# Patient Record
Sex: Female | Born: 1935 | Hispanic: Yes | Smoking: Never smoker
Health system: Southern US, Community
[De-identification: ages and names within clinical notes are randomized; demographics above are authoritative.]

## PROBLEM LIST (undated history)

## (undated) DIAGNOSIS — R0989 Other specified symptoms and signs involving the circulatory and respiratory systems: Secondary | ICD-10-CM

## (undated) DIAGNOSIS — G4700 Insomnia, unspecified: Secondary | ICD-10-CM

## (undated) DIAGNOSIS — B958 Unspecified staphylococcus as the cause of diseases classified elsewhere: Secondary | ICD-10-CM

## (undated) DIAGNOSIS — I6529 Occlusion and stenosis of unspecified carotid artery: Secondary | ICD-10-CM

## (undated) DIAGNOSIS — E041 Nontoxic single thyroid nodule: Secondary | ICD-10-CM

## (undated) DIAGNOSIS — E785 Hyperlipidemia, unspecified: Secondary | ICD-10-CM

## (undated) DIAGNOSIS — R7989 Other specified abnormal findings of blood chemistry: Secondary | ICD-10-CM

## (undated) DIAGNOSIS — Z872 Personal history of diseases of the skin and subcutaneous tissue: Secondary | ICD-10-CM

## (undated) HISTORY — DX: Personal history of diseases of the skin and subcutaneous tissue: Z87.2

## (undated) HISTORY — PX: TONSILLECTOMY: SUR1361

## (undated) HISTORY — DX: Insomnia, unspecified: G47.00

## (undated) HISTORY — DX: Hyperlipidemia, unspecified: E78.5

## (undated) HISTORY — DX: Nontoxic single thyroid nodule: E04.1

## (undated) HISTORY — DX: Unspecified staphylococcus as the cause of diseases classified elsewhere: B95.8

## (undated) HISTORY — DX: Other specified abnormal findings of blood chemistry: R79.89

## (undated) HISTORY — DX: Occlusion and stenosis of unspecified carotid artery: I65.29

## (undated) HISTORY — DX: Other specified symptoms and signs involving the circulatory and respiratory systems: R09.89

---

## 2012-12-03 DIAGNOSIS — R7989 Other specified abnormal findings of blood chemistry: Secondary | ICD-10-CM

## 2012-12-03 DIAGNOSIS — I6529 Occlusion and stenosis of unspecified carotid artery: Secondary | ICD-10-CM

## 2012-12-03 HISTORY — PX: OTHER SURGICAL HISTORY: SHX169

## 2012-12-03 HISTORY — DX: Other specified abnormal findings of blood chemistry: R79.89

## 2012-12-03 HISTORY — DX: Occlusion and stenosis of unspecified carotid artery: I65.29

## 2013-06-12 LAB — BASIC METABOLIC PANEL
BUN: 19 mg/dL (ref 4–21)
Creatinine: 0.8 mg/dL (ref 0.5–1.1)
Glucose: 98 mg/dL
POTASSIUM: 4.6 mmol/L (ref 3.4–5.3)
Sodium: 139 mmol/L (ref 137–147)

## 2013-06-12 LAB — CBC AND DIFFERENTIAL
HCT: 38 % (ref 36–46)
HEMOGLOBIN: 12.7 g/dL (ref 12.0–16.0)
PLATELETS: 163 10*3/uL (ref 150–399)
WBC: 4.6 10^3/mL

## 2013-06-12 LAB — LIPID PANEL
Cholesterol: 197 mg/dL (ref 0–200)
HDL: 73 mg/dL — AB (ref 35–70)
LDL CALC: 92 mg/dL
Triglycerides: 155 mg/dL (ref 40–160)

## 2013-06-12 LAB — HEPATIC FUNCTION PANEL
ALK PHOS: 65 U/L (ref 25–125)
ALT: 31 U/L (ref 7–35)
AST: 28 U/L (ref 13–35)
BILIRUBIN, TOTAL: 0.6 mg/dL

## 2013-06-12 LAB — TSH: TSH: 4.76 u[IU]/mL (ref 0.41–5.90)

## 2013-06-18 DIAGNOSIS — R0989 Other specified symptoms and signs involving the circulatory and respiratory systems: Secondary | ICD-10-CM

## 2013-06-18 HISTORY — DX: Other specified symptoms and signs involving the circulatory and respiratory systems: R09.89

## 2013-06-24 DIAGNOSIS — E041 Nontoxic single thyroid nodule: Secondary | ICD-10-CM

## 2013-06-24 HISTORY — DX: Nontoxic single thyroid nodule: E04.1

## 2013-08-13 HISTORY — PX: BIOPSY THYROID: PRO38

## 2013-11-02 DIAGNOSIS — B958 Unspecified staphylococcus as the cause of diseases classified elsewhere: Secondary | ICD-10-CM

## 2013-11-02 DIAGNOSIS — Z872 Personal history of diseases of the skin and subcutaneous tissue: Secondary | ICD-10-CM

## 2013-11-02 HISTORY — DX: Personal history of diseases of the skin and subcutaneous tissue: Z87.2

## 2013-11-02 HISTORY — DX: Unspecified staphylococcus as the cause of diseases classified elsewhere: B95.8

## 2013-11-23 LAB — BASIC METABOLIC PANEL
BUN: 16 mg/dL (ref 4–21)
Creatinine: 0.7 mg/dL (ref 0.5–1.1)
GLUCOSE: 91 mg/dL
Potassium: 4.1 mmol/L (ref 3.4–5.3)
SODIUM: 142 mmol/L (ref 137–147)

## 2013-11-23 LAB — HEPATIC FUNCTION PANEL: Alkaline Phosphatase: 63 U/L (ref 25–125)

## 2014-03-10 DIAGNOSIS — Z0000 Encounter for general adult medical examination without abnormal findings: Secondary | ICD-10-CM | POA: Diagnosis not present

## 2014-03-10 DIAGNOSIS — Z23 Encounter for immunization: Secondary | ICD-10-CM | POA: Diagnosis not present

## 2014-03-10 DIAGNOSIS — G4700 Insomnia, unspecified: Secondary | ICD-10-CM | POA: Diagnosis not present

## 2014-03-10 DIAGNOSIS — R946 Abnormal results of thyroid function studies: Secondary | ICD-10-CM | POA: Diagnosis not present

## 2014-03-10 LAB — TSH: TSH: 2.73 u[IU]/mL (ref 0.41–5.90)

## 2014-08-26 DIAGNOSIS — Z23 Encounter for immunization: Secondary | ICD-10-CM | POA: Diagnosis not present

## 2015-06-17 ENCOUNTER — Telehealth: Payer: Self-pay | Admitting: *Deleted

## 2015-06-17 NOTE — Telephone Encounter (Signed)
Unable to reach patient at time of Pre-Visit Call.  Left message for patient to return call when available.    

## 2015-06-20 ENCOUNTER — Ambulatory Visit (INDEPENDENT_AMBULATORY_CARE_PROVIDER_SITE_OTHER): Payer: Medicare Other | Admitting: Internal Medicine

## 2015-06-20 ENCOUNTER — Encounter: Payer: Self-pay | Admitting: Internal Medicine

## 2015-06-20 VITALS — BP 106/74 | HR 89 | Temp 98.1°F | Ht 60.0 in | Wt 133.1 lb

## 2015-06-20 DIAGNOSIS — E041 Nontoxic single thyroid nodule: Secondary | ICD-10-CM | POA: Diagnosis not present

## 2015-06-20 DIAGNOSIS — J4520 Mild intermittent asthma, uncomplicated: Secondary | ICD-10-CM | POA: Diagnosis not present

## 2015-06-20 DIAGNOSIS — R0989 Other specified symptoms and signs involving the circulatory and respiratory systems: Secondary | ICD-10-CM

## 2015-06-20 DIAGNOSIS — J45909 Unspecified asthma, uncomplicated: Secondary | ICD-10-CM | POA: Insufficient documentation

## 2015-06-20 DIAGNOSIS — E785 Hyperlipidemia, unspecified: Secondary | ICD-10-CM | POA: Insufficient documentation

## 2015-06-20 MED ORDER — MONTELUKAST SODIUM 10 MG PO TABS: 10.0000 mg | ORAL_TABLET | Freq: Every day | ORAL | Status: AC

## 2015-06-20 MED ORDER — ALBUTEROL SULFATE HFA 108 (90 BASE) MCG/ACT IN AERS: 2.0000 | INHALATION_SPRAY | Freq: Four times a day (QID) | RESPIRATORY_TRACT | Status: AC | PRN

## 2015-06-20 NOTE — Assessment & Plan Note (Signed)
Reports a thyroid biopsy before with normal TSH. Will get records.

## 2015-06-20 NOTE — Progress Notes (Signed)
Pre visit review using our clinic review tool, if applicable. No additional management support is needed unless otherwise documented below in the visit note. 

## 2015-06-20 NOTE — Assessment & Plan Note (Signed)
Reports a history of asthma, asymptomatic except for the last few days with cough, on exam there is no wheezing. Patient would like to have Singulair and albuterol in case she needs it. Prescriptions provided.

## 2015-06-20 NOTE — Patient Instructions (Addendum)
Please sign and fax a release of information for your previous doctor: get all records    If  cough, take Mucinex DM twice a day as needed  If nasal  congestion use OTC Nasocort or Flonase : 2 nasal sprays on each side of the nose daily until you feel better   If you develop wheezing  or asthma type of symptoms: Start singular  And use albuterol as needed

## 2015-06-20 NOTE — Progress Notes (Signed)
   Subjective:    Patient ID: Paula Rollins, female    DOB: 1932/02/01, 81 y.o.   MRN: [id number]  DOS:  06/20/2015 Type of visit - description : New patient, to get established. here with a family member Interval history: Patient used to see Dr. Cristino Martes in [place] likes to transfer to his practice. She is concerned about her history of asthma which has been inactive for a while however in the last few days she has developed cough and would like to have some medication in case she gets worse   Review of Systems At this point denies any fever chills No sinus pain or congestion. No nasal discharge No nausea, vomiting, diarrhea.  Past Medical History  Diagnosis Date  . Hyperlipidemia   . Solitary thyroid nodule 06/24/2013    Hypervascular.  Referred to Dr Steffanie Dunn 06/2013. bx neg per pt    . Carotid artery bruit 06/18/2013    Overview:  Mild atherosclerosis. No stenosis as per Doppler 06/2013     Past Surgical History  Procedure Laterality Date  . Tonsillectomy      History   Social History  . Marital Status: Widowed    Spouse Name: N/A  . Number of Children: 4  . Years of Education: N/A   Occupational History  . Not on file.   Social History Main Topics  . Smoking status: Never Smoker   . Smokeless tobacco: Not on file  . Alcohol Use: No  . Drug Use: No  . Sexual Activity: Not on file   Other Topics Concern  . Not on file   Social History Narrative   Original from [place]   In [place] since ~ 2012     Family History  Problem Relation Age of Onset  . Breast cancer Neg Hx   . Colon cancer Neg Hx   . CAD Neg Hx        Medication List       This list is accurate as of: 06/20/15 11:59 PM.  Always use your most recent med list.               albuterol 108 (90 BASE) MCG/ACT inhaler  Commonly known as:  VENTOLIN HFA  Inhale 2 puffs into the lungs every 6 (six) hours as needed for wheezing or shortness of breath.     montelukast 10 MG  tablet  Commonly known as:  SINGULAIR  Take 1 tablet (10 mg total) by mouth at bedtime.           Objective:   Physical Exam BP 106/74 mmHg  Pulse 89  Temp(Src) 98.1 F (36.7 C) (Oral)  Ht 5' (1.524 m)  Wt 133 lb 2 oz (60.385 kg)  BMI 26.00 kg/m2  SpO2 97%    General:   Well developed, well nourished . NAD.  HEENT:  Normocephalic . Face symmetric, atraumatic;  no thyromegaly TMs normal, sinus no TTP, nose is slightly congested Neck: Good carotid pulses B Lungs:  CTA B Normal respiratory effort, no intercostal retractions, no accessory muscle use. Heart: RRR,  no murmur.  No pretibial edema bilaterally  Skin: Not pale. Not jaundice Neurologic:  alert & oriented X3.  Speech normal, gait appropriate for age and unassisted Psych--  Cognition and judgment appear intact.  Cooperative with normal attention span and concentration.  Behavior appropriate. No anxious or depressed appearing.   Assessment & Plan:

## 2015-06-20 NOTE — Assessment & Plan Note (Signed)
Normal carotid pulses today. Get records

## 2015-07-14 ENCOUNTER — Telehealth: Payer: Self-pay | Admitting: Internal Medicine

## 2015-07-14 NOTE — Telephone Encounter (Signed)
Labs 06/11/2013: TSH was a slightly elevated at 4.7, was not interested on medication. Labs 03/10/2014: TSH 2.7, normal. Free T4 1 0.2 normal, free T3 2 0.3 normal   Carotid  ultrasound 06/17/2013: No carotid disease, + left thyroid nodule Thyroid ultrasound 06/23/2015: Large hypervascular nodule inferior left lobe Was referred to Dr. Steffanie Dunn, endocrinology. Prescribed a FNA, did not recommend treatment for subclinical hypothyroidism FNA 9 2014: Hyperplastic nodule  Pneumonia shot 12/03/2006 Zostavax 06/11/2013  Admitted to the hospital with a infection in the finger 11-2013, fax copies poor quality

## 2015-07-27 DIAGNOSIS — H2513 Age-related nuclear cataract, bilateral: Secondary | ICD-10-CM | POA: Diagnosis not present

## 2015-08-16 ENCOUNTER — Encounter: Payer: Self-pay | Admitting: Internal Medicine

## 2015-08-16 ENCOUNTER — Ambulatory Visit (HOSPITAL_BASED_OUTPATIENT_CLINIC_OR_DEPARTMENT_OTHER)
Admission: RE | Admit: 2015-08-16 | Discharge: 2015-08-16 | Disposition: A | Discharge status: Home / Self Care | Payer: Medicare Other | Source: Ambulatory Visit | Attending: Internal Medicine | Admitting: Internal Medicine

## 2015-08-16 ENCOUNTER — Ambulatory Visit (INDEPENDENT_AMBULATORY_CARE_PROVIDER_SITE_OTHER): Payer: Medicare Other | Admitting: Internal Medicine

## 2015-08-16 VITALS — BP 116/58 | HR 96 | Temp 98.1°F | Ht 60.0 in | Wt 137.2 lb

## 2015-08-16 DIAGNOSIS — R2241 Localized swelling, mass and lump, right lower limb: Secondary | ICD-10-CM | POA: Insufficient documentation

## 2015-08-16 DIAGNOSIS — M179 Osteoarthritis of knee, unspecified: Secondary | ICD-10-CM

## 2015-08-16 DIAGNOSIS — M25469 Effusion, unspecified knee: Secondary | ICD-10-CM | POA: Diagnosis not present

## 2015-08-16 DIAGNOSIS — Z23 Encounter for immunization: Secondary | ICD-10-CM | POA: Diagnosis not present

## 2015-08-16 DIAGNOSIS — M11261 Other chondrocalcinosis, right knee: Secondary | ICD-10-CM | POA: Insufficient documentation

## 2015-08-16 DIAGNOSIS — M1711 Unilateral primary osteoarthritis, right knee: Secondary | ICD-10-CM

## 2015-08-16 DIAGNOSIS — Z09 Encounter for follow-up examination after completed treatment for conditions other than malignant neoplasm: Secondary | ICD-10-CM

## 2015-08-16 NOTE — Progress Notes (Signed)
Pre visit review using our clinic review tool, if applicable. No additional management support is needed unless otherwise documented below in the visit note. 

## 2015-08-16 NOTE — Patient Instructions (Signed)
  Stop by the first floor and get the XR   For knee pain: ICE every night Tylenol  500 mg OTC 2 tabs a day every 8 hours as needed for pain Get a knee sleeve We are referring you to a sports medicine   Come back in October for your physical exam. You already have an appointment

## 2015-08-16 NOTE — Progress Notes (Signed)
Subjective:    Patient ID: Paula Rollins, female    DOB: 29-Oct-1932, 81 y.o.   MRN: [id number]  DOS:  08/16/2015 Type of visit - description :  Acute visit, here with her son Interval history: 3 weeks history of on and off pain and swelling of the right knee. Denies any redness, occasionally the knee feels warm. No history of injury, trauma or previous problems. Left knee essentially asymptomatic.   Review of Systems Denies fever chills  Past Medical History  Diagnosis Date  . Hyperlipidemia   . Solitary thyroid nodule 06/24/2013    Hypervascular.  Referred to Dr Steffanie Dunn 06/2013. bx neg per pt  . Carotid artery bruit 06/18/2013    Overview:  Mild atherosclerosis. No stenosis as per Doppler 06/2013   . Elevated TSH 2014  . Carotid artery plaque 2014    No hemodynamically significant stenosis on either side  . History of cellulitis 11/2013  . Staph infection 11/2013    Right index finger  . Insomnia     Past Surgical History  Procedure Laterality Date  . Tonsillectomy    . Biopsy thyroid  08/13/2013    Hyperplastic/goitrous nodule on left side  . Fna with ultrasound guidance of thyroid  2014    Social History   Social History  . Marital Status: Widowed    Spouse Name: N/A  . Number of Children: 4  . Years of Education: N/A   Occupational History  . Not on file.   Social History Main Topics  . Smoking status: Never Smoker   . Smokeless tobacco: Not on file  . Alcohol Use: No  . Drug Use: No  . Sexual Activity: Not on file   Other Topics Concern  . Not on file   Social History Narrative   Original from [place]   In [place] since ~ 2012        Medication List       This list is accurate as of: 08/16/15 11:59 PM.  Always use your most recent med list.               albuterol 108 (90 BASE) MCG/ACT inhaler  Commonly known as:  VENTOLIN HFA  Inhale 2 puffs into the lungs every 6 (six) hours as needed for wheezing or shortness of breath.     CALTRATE  600+D PLUS MINERALS PO  Take 1 tablet by mouth daily.     montelukast 10 MG tablet  Commonly known as:  SINGULAIR  Take 1 tablet (10 mg total) by mouth at bedtime.           Objective:   Physical Exam BP 116/58 mmHg  Pulse 96  Temp(Src) 98.1 F (36.7 C) (Oral)  Ht 5' (1.524 m)  Wt 137 lb 4 oz (62.256 kg)  BMI 26.80 kg/m2  SpO2 96% General:   Well developed, well nourished . NAD.  HEENT:  Normocephalic . Face symmetric, atraumatic MSK: Left knee: Mild deformities consistent with DJD, no red, swollen. Right knee: Mild deformities consistent with DJD, mild effusion, range of motion satisfactory, joint is stable. Calves symmetric Neurologic:  alert & oriented X3.  Speech normal, gait appropriate for age and unassisted Psych--  Cognition and judgment appear intact.  Cooperative with normal attention span and concentration.  Behavior appropriate. No anxious or depressed appearing.      Assessment & Plan:  A/P DJD: Symptoms likely due to DJD, recommend x-ray, Tylenol, ice, XR We'll refer to sports medicine, if she  has a significant effusion may benefit from a tap. Fidgety: Also, her son reports that she has been fidgety all the time, can't stop doing things. The patient reports that she doesn't feel anxious, simply she likes to be  busy. I don't recommend any specific treatment at this point.

## 2015-08-17 DIAGNOSIS — Z09 Encounter for follow-up examination after completed treatment for conditions other than malignant neoplasm: Secondary | ICD-10-CM | POA: Insufficient documentation

## 2015-08-17 NOTE — Assessment & Plan Note (Signed)
DJD: Symptoms likely due to DJD, recommend x-ray, Tylenol, ice, XR We'll refer to sports medicine, if she has a significant effusion may benefit from a tap. Fidgety: Also, her son reports that she has been fidgety all the time, can't stop doing things. The patient reports that she doesn't feel anxious, simply she likes to be  busy. I don't recommend any specific treatment at this point.

## 2015-08-22 ENCOUNTER — Encounter: Payer: Self-pay | Admitting: Family Medicine

## 2015-08-22 ENCOUNTER — Ambulatory Visit: Payer: Medicare Other | Admitting: Family Medicine

## 2015-08-22 ENCOUNTER — Ambulatory Visit (INDEPENDENT_AMBULATORY_CARE_PROVIDER_SITE_OTHER): Payer: Medicare Other | Admitting: Family Medicine

## 2015-08-22 VITALS — BP 132/63 | HR 82 | Ht 62.0 in | Wt 135.0 lb

## 2015-08-22 DIAGNOSIS — M25561 Pain in right knee: Secondary | ICD-10-CM

## 2015-08-22 MED ORDER — METHYLPREDNISOLONE ACETATE 40 MG/ML IJ SUSP
40.0000 mg | Freq: Once | INTRAMUSCULAR | Status: AC
  Administered 2015-08-22: 40 mg via INTRA_ARTICULAR

## 2015-08-22 NOTE — Patient Instructions (Signed)
Your pain is due to arthritis, less likely something like gout/pseudogout though both are treated similarly. These are the 4 different classes of medicine you can take for this: Tylenol 500mg  1-2 tabs three times a day for pain. Ibuprofen 600mg  three times a day with food OR Aleve 1-2 tabs twice a day with food for 7-10 days then as needed. Glucosamine sulfate 750mg  twice a day is a supplement that may help. Capsaicin, aspercreme, or biofreeze topically up to four times a day may also help with pain. Cortisone injections are an option - let me know if you want one of these. If cortisone injections do not help, there are different types of shots that may help but they take longer to take effect. It's important that you continue to stay active. Straight leg raises, knee extensions 3 sets of 10 once a day (add ankle weight if these become too easy). Consider physical therapy to strengthen muscles around the joint that hurts to take pressure off of the joint itself. Shoe inserts with good arch support may be helpful. Walker or cane if needed. Heat or ice 15 minutes at a time 3-4 times a day as needed to help with pain. Water aerobics and cycling with low resistance are the best two types of exercise for arthritis. Follow up with me as needed otherwise.

## 2015-08-24 NOTE — Progress Notes (Signed)
PCP and referred by: Kathlene November, MD  Subjective:   HPI: Patient is a 81 y.o. female here for right knee pain.  Patient reports about 3 weeks of worsening anterior right knee pain. Pain 0/10 currently but up to 1/61 with certain movements, getting up from sitting position. No recent trauma or injury. Some swelling, redness at times. No bruising. Tried a copper brace, ginger. Radiographs with mild medial and patellofemoral DJD. No catching, locking, giving out.  Past Medical History  Diagnosis Date  . Hyperlipidemia   . Solitary thyroid nodule 06/24/2013    Hypervascular.  Referred to Dr Steffanie Dunn 06/2013. bx neg per pt  . Carotid artery bruit 06/18/2013    Overview:  Mild atherosclerosis. No stenosis as per Doppler 06/2013   . Elevated TSH 2014  . Carotid artery plaque 2014    No hemodynamically significant stenosis on either side  . History of cellulitis 11/2013  . Staph infection 11/2013    Right index finger  . Insomnia     Current Outpatient Prescriptions on File Prior to Visit  Medication Sig Dispense Refill  . albuterol (VENTOLIN HFA) 108 (90 BASE) MCG/ACT inhaler Inhale 2 puffs into the lungs every 6 (six) hours as needed for wheezing or shortness of breath. (Patient not taking: Reported on 08/16/2015) 1 Inhaler 1  . Calcium Carbonate-Vit D-Min (CALTRATE 600+D PLUS MINERALS PO) Take 1 tablet by mouth daily.    . montelukast (SINGULAIR) 10 MG tablet Take 1 tablet (10 mg total) by mouth at bedtime. (Patient not taking: Reported on 08/16/2015) 30 tablet 3   No current facility-administered medications on file prior to visit.    Past Surgical History  Procedure Laterality Date  . Tonsillectomy    . Biopsy thyroid  08/13/2013    Hyperplastic/goitrous nodule on left side  . Fna with ultrasound guidance of thyroid  2014    No Known Allergies  Social History   Social History  . Marital Status: Widowed    Spouse Name: N/A  . Number of Children: 4  . Years of Education:  N/A   Occupational History  . Not on file.   Social History Main Topics  . Smoking status: Never Smoker   . Smokeless tobacco: Not on file  . Alcohol Use: No  . Drug Use: No  . Sexual Activity: Not on file   Other Topics Concern  . Not on file   Social History Narrative   Original from [place]   In [place] since ~ 2012    Family History  Problem Relation Age of Onset  . Breast cancer Neg Hx   . Colon cancer Neg Hx   . CAD Neg Hx     BP 132/63 mmHg  Pulse 82  Ht 5\' 2"  (1.575 m)  Wt 135 lb (61.236 kg)  BMI 24.69 kg/m2  Review of Systems: See HPI above.    Objective:  Physical Exam:  Gen: NAD  Right knee: Mild effusion.  No bruising, other deformity. TTP medial joint line.  No other tenderness. FROM. Negative ant/post drawers. Negative valgus/varus testing. Negative lachmanns. Negative mcmurrays, apleys, patellar apprehension. NV intact distally.    Assessment & Plan:  1. Right knee pain - 2/2 DJD, less likely gout/pseudogout.  Given intraarticular injection today.  Discussed tylenol, nsaids, glucosamine, topical medications.  Shown home exercises to do daily, recommended arch supports.  Heat/ice if needed.  F/u prn.  After informed written consent, patient was seated on exam table. Right knee was prepped with  alcohol swab and utilizing superolateral approach with ultrasound guidance, patient's right knee was injected intraarticularly with 3:1 marcaine: depomedrol. Patient tolerated the procedure well without immediate complications.

## 2015-08-25 DIAGNOSIS — M25561 Pain in right knee: Secondary | ICD-10-CM | POA: Insufficient documentation

## 2015-08-25 NOTE — Assessment & Plan Note (Signed)
2/2 DJD, less likely gout/pseudogout.  Given intraarticular injection today.  Discussed tylenol, nsaids, glucosamine, topical medications.  Shown home exercises to do daily, recommended arch supports.  Heat/ice if needed.  F/u prn.  After informed written consent, patient was seated on exam table. Right knee was prepped with alcohol swab and utilizing superolateral approach with ultrasound guidance, patient's right knee was injected intraarticularly with 3:1 marcaine: depomedrol. Patient tolerated the procedure well without immediate complications.

## 2015-09-26 ENCOUNTER — Ambulatory Visit (INDEPENDENT_AMBULATORY_CARE_PROVIDER_SITE_OTHER): Payer: Medicare Other | Admitting: Internal Medicine

## 2015-09-26 ENCOUNTER — Encounter: Payer: Self-pay | Admitting: Internal Medicine

## 2015-09-26 VITALS — BP 126/76 | HR 85 | Temp 98.4°F | Ht 62.0 in | Wt 134.4 lb

## 2015-09-26 DIAGNOSIS — E785 Hyperlipidemia, unspecified: Secondary | ICD-10-CM | POA: Diagnosis not present

## 2015-09-26 DIAGNOSIS — R946 Abnormal results of thyroid function studies: Secondary | ICD-10-CM

## 2015-09-26 DIAGNOSIS — Z23 Encounter for immunization: Secondary | ICD-10-CM | POA: Diagnosis not present

## 2015-09-26 DIAGNOSIS — M179 Osteoarthritis of knee, unspecified: Secondary | ICD-10-CM | POA: Diagnosis not present

## 2015-09-26 DIAGNOSIS — M1711 Unilateral primary osteoarthritis, right knee: Secondary | ICD-10-CM

## 2015-09-26 DIAGNOSIS — R7989 Other specified abnormal findings of blood chemistry: Secondary | ICD-10-CM

## 2015-09-26 DIAGNOSIS — Z09 Encounter for follow-up examination after completed treatment for conditions other than malignant neoplasm: Secondary | ICD-10-CM

## 2015-09-26 NOTE — Assessment & Plan Note (Signed)
DJD: Saw sports medicine, they think symptoms are likely DJD; XR; DJD and  ?CPPD.  Recommend conservative treatment with judicious use of ice, Tylenol, Motrin, brace. See instructions. If not better, next step --->  ?ortho (although she is not considering surgery) Abnormal TSH labs Hyperlipidemia: Check a CMP and FLP Primary care: Td, Prevnar RTC 4-5 months for a physical

## 2015-09-26 NOTE — Progress Notes (Signed)
Subjective:    Patient ID: Paula Rollins, female    DOB: [birth year], 81 y.o.   MRN: [id number]  DOS:  09/26/2015 Type of visit - description : Routine checkup, here with her daughter Interval history: In general feeling well. Continue with knee pain, daily, mild to moderate. Has not taking  any medication for pain. Saw ports medicine, got a local injection, no help.  Review of Systems   Past Medical History  Diagnosis Date  . Hyperlipidemia   . Solitary thyroid nodule 06/24/2013    Hypervascular.  Referred to Dr Steffanie Dunn 06/2013. bx neg per pt  . Carotid artery bruit 06/18/2013    Overview:  Mild atherosclerosis. No stenosis as per Doppler 06/2013   . Elevated TSH 2014  . Carotid artery plaque 2014    No hemodynamically significant stenosis on either side  . History of cellulitis 11/2013  . Staph infection 11/2013    Right index finger  . Insomnia     Past Surgical History  Procedure Laterality Date  . Tonsillectomy    . Biopsy thyroid  08/13/2013    Hyperplastic/goitrous nodule on left side  . Fna with ultrasound guidance of thyroid  2014    Social History   Social History  . Marital Status: Widowed    Spouse Name: N/A  . Number of Children: 4  . Years of Education: N/A   Occupational History  . Not on file.   Social History Main Topics  . Smoking status: Never Smoker   . Smokeless tobacco: Not on file  . Alcohol Use: No  . Drug Use: No  . Sexual Activity: Not on file   Other Topics Concern  . Not on file   Social History Narrative   Original from [place]   In [place] since ~ 2012        Medication List       This list is accurate as of: 09/26/15  5:17 PM.  Always use your most recent med list.               albuterol 108 (90 BASE) MCG/ACT inhaler  Commonly known as:  VENTOLIN HFA  Inhale 2 puffs into the lungs every 6 (six) hours as needed for wheezing or shortness of breath.     CALTRATE 600+D PLUS MINERALS PO  Take 1 tablet by mouth daily.      montelukast 10 MG tablet  Commonly known as:  SINGULAIR  Take 1 tablet (10 mg total) by mouth at bedtime.           Objective:   Physical Exam BP 126/76 mmHg  Pulse 85  Temp(Src) 98.4 F (36.9 C) (Oral)  Ht 5\' 2"  (1.575 m)  Wt 134 lb 6 oz (60.952 kg)  BMI 24.57 kg/m2  SpO2 96% General:   Well developed, well nourished . NAD.  HEENT:  Normocephalic . Face symmetric, atraumatic Lungs:  CTA B Normal respiratory effort, no intercostal retractions, no accessory muscle use. Heart: RRR,  no murmur.  MSK: Has a knee brace on the right, right knee with changes consistent with DJD, no effusion today, no warm, redness. Mild right pretibial edema (from brace?) Skin: Not pale. Not jaundice Neurologic:  alert & oriented X3.  Speech normalv Psych--  Cognition and judgment appear intact.  Cooperative with normal attention span and concentration.  Behavior appropriate. No anxious or depressed appearing.      Assessment & Plan:   Assessment > Elevated TSH Hyperlipidemia Insomnia Carotid bruit ,  [place] 2014 (-)  Stenosis , Solitary thyroid nodule, reports (-) BX 2014  H/o asthma  Plan DJD: Saw sports medicine, they think symptoms are likely DJD; XR; DJD and  ?CPPD.  Recommend conservative treatment with judicious use of ice, Tylenol, Motrin, brace. See instructions. If not better, next step --->  ?ortho (although she is not considering surgery) Abnormal TSH labs Hyperlipidemia: Check a CMP and FLP Primary care: Td, Prevnar RTC 4-5 months for a physical

## 2015-09-26 NOTE — Progress Notes (Signed)
Pre visit review using our clinic review tool, if applicable. No additional management support is needed unless otherwise documented below in the visit note. 

## 2015-09-26 NOTE — Patient Instructions (Addendum)
Please schedule labs to be done within few days (fasting)  For arthritis: --Use knee support and a cane --Ice as needed --Tylenol  500 mg OTC 2 tabs a day every 8 hours as needed for pain --IBUPROFEN (Advil or Motrin) 200 mg 2 tablets together as needed for pain. Use ibuprofen sporadically, 2 or 3 times a week? Always take it with food because may cause gastritis and ulcers.  If you notice nausea, stomach pain, change in the color of stools --->  Stop the medicine and let us know     Next visit  for a physical exam in 4 months Please schedule an appointment at the front desk  No need to come back fasting

## 2015-10-14 ENCOUNTER — Other Ambulatory Visit (INDEPENDENT_AMBULATORY_CARE_PROVIDER_SITE_OTHER): Payer: Medicare Other

## 2015-10-14 DIAGNOSIS — R7989 Other specified abnormal findings of blood chemistry: Secondary | ICD-10-CM

## 2015-10-14 DIAGNOSIS — R946 Abnormal results of thyroid function studies: Secondary | ICD-10-CM | POA: Diagnosis not present

## 2015-10-14 DIAGNOSIS — E785 Hyperlipidemia, unspecified: Secondary | ICD-10-CM | POA: Diagnosis not present

## 2015-10-14 LAB — LIPID PANEL
CHOL/HDL RATIO: 3
Cholesterol: 192 mg/dL (ref 0–200)
HDL: 58.6 mg/dL (ref >39.00)
LDL Cholesterol: 113 mg/dL — ABNORMAL HIGH (ref 0–99)
NONHDL: 132.91
TRIGLYCERIDES: 102 mg/dL (ref 0.0–149.0)
VLDL: 20.4 mg/dL (ref 0.0–40.0)

## 2015-10-14 LAB — COMPREHENSIVE METABOLIC PANEL
ALT: 12 U/L (ref 0–35)
AST: 13 U/L (ref 0–37)
Albumin: 3.5 g/dL (ref 3.5–5.2)
Alkaline Phosphatase: 68 U/L (ref 39–117)
BUN: 23 mg/dL (ref 6–23)
CHLORIDE: 101 meq/L (ref 96–112)
CO2: 32 meq/L (ref 19–32)
Calcium: 9.4 mg/dL (ref 8.4–10.5)
Creatinine, Ser: 0.98 mg/dL (ref 0.40–1.20)
GFR: 57.88 mL/min — AB (ref >60.00)
GLUCOSE: 105 mg/dL — AB (ref 70–99)
POTASSIUM: 4.3 meq/L (ref 3.5–5.1)
Sodium: 140 mEq/L (ref 135–145)
Total Bilirubin: 0.3 mg/dL (ref 0.2–1.2)
Total Protein: 8 g/dL (ref 6.0–8.3)

## 2015-10-14 LAB — TSH: TSH: 2.55 u[IU]/mL (ref 0.35–4.50)

## 2015-11-23 IMAGING — DX DG KNEE COMPLETE 4+V*R*
4 series · 4 of 4 positions shown · non-contrast
Comparison: None.

CLINICAL DATA: Right knee swelling for 2 weeks, no injury

EXAM:
RIGHT KNEE - COMPLETE 4+ VIEW

[knee ap]
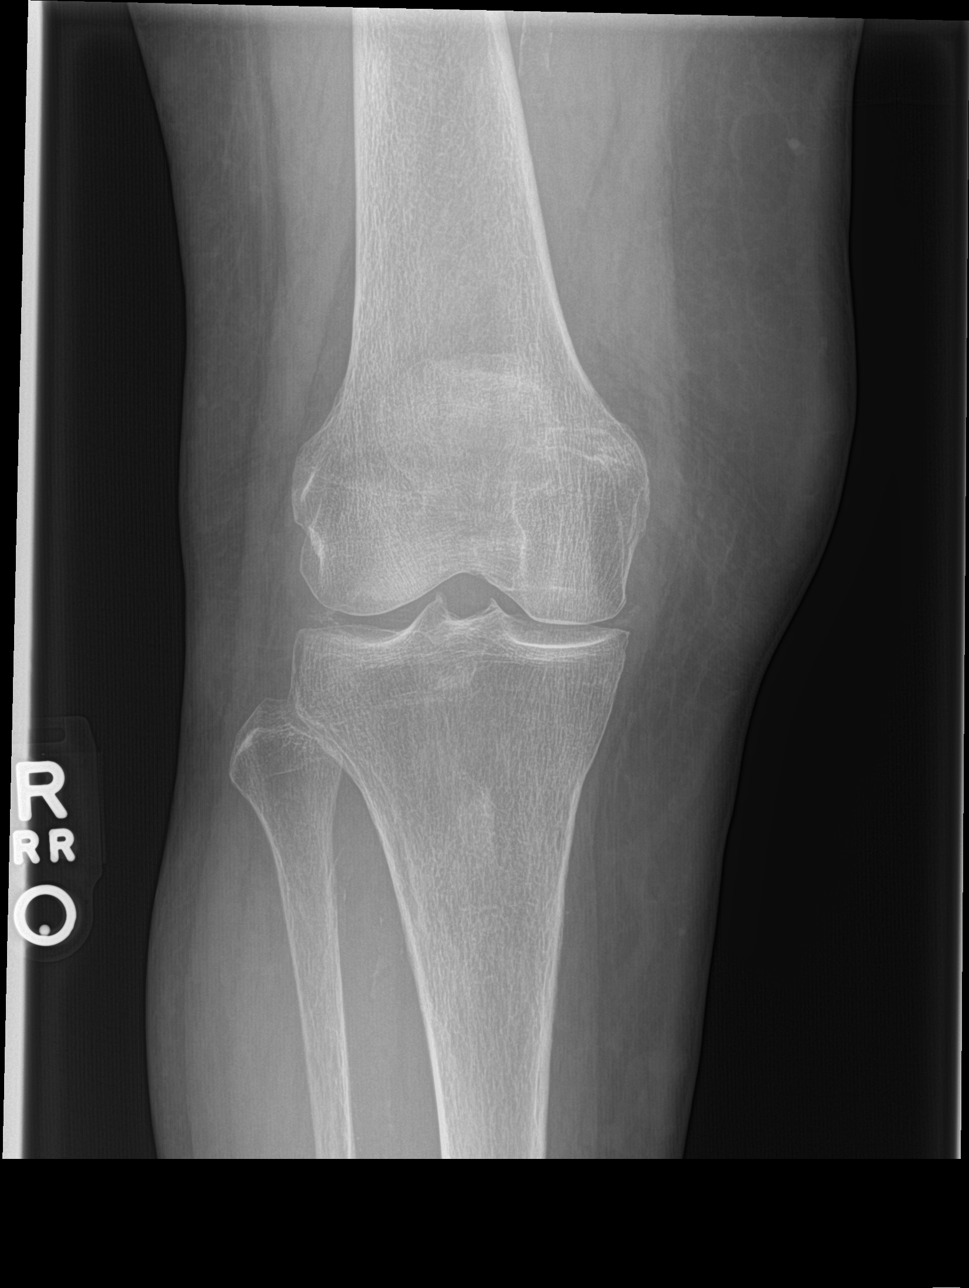

[tunnel]
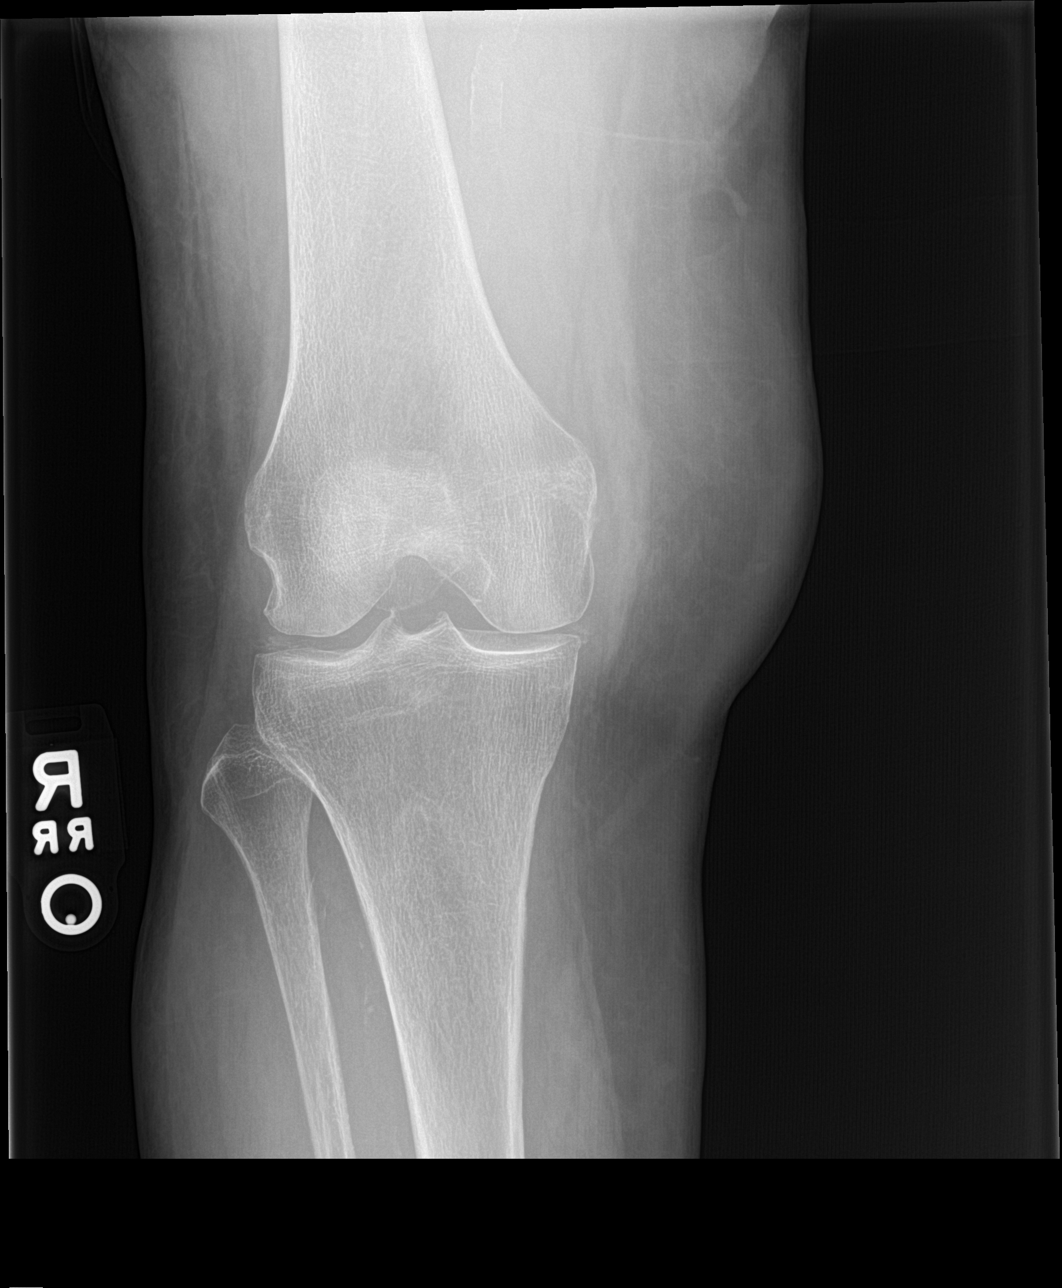

[knee lat]
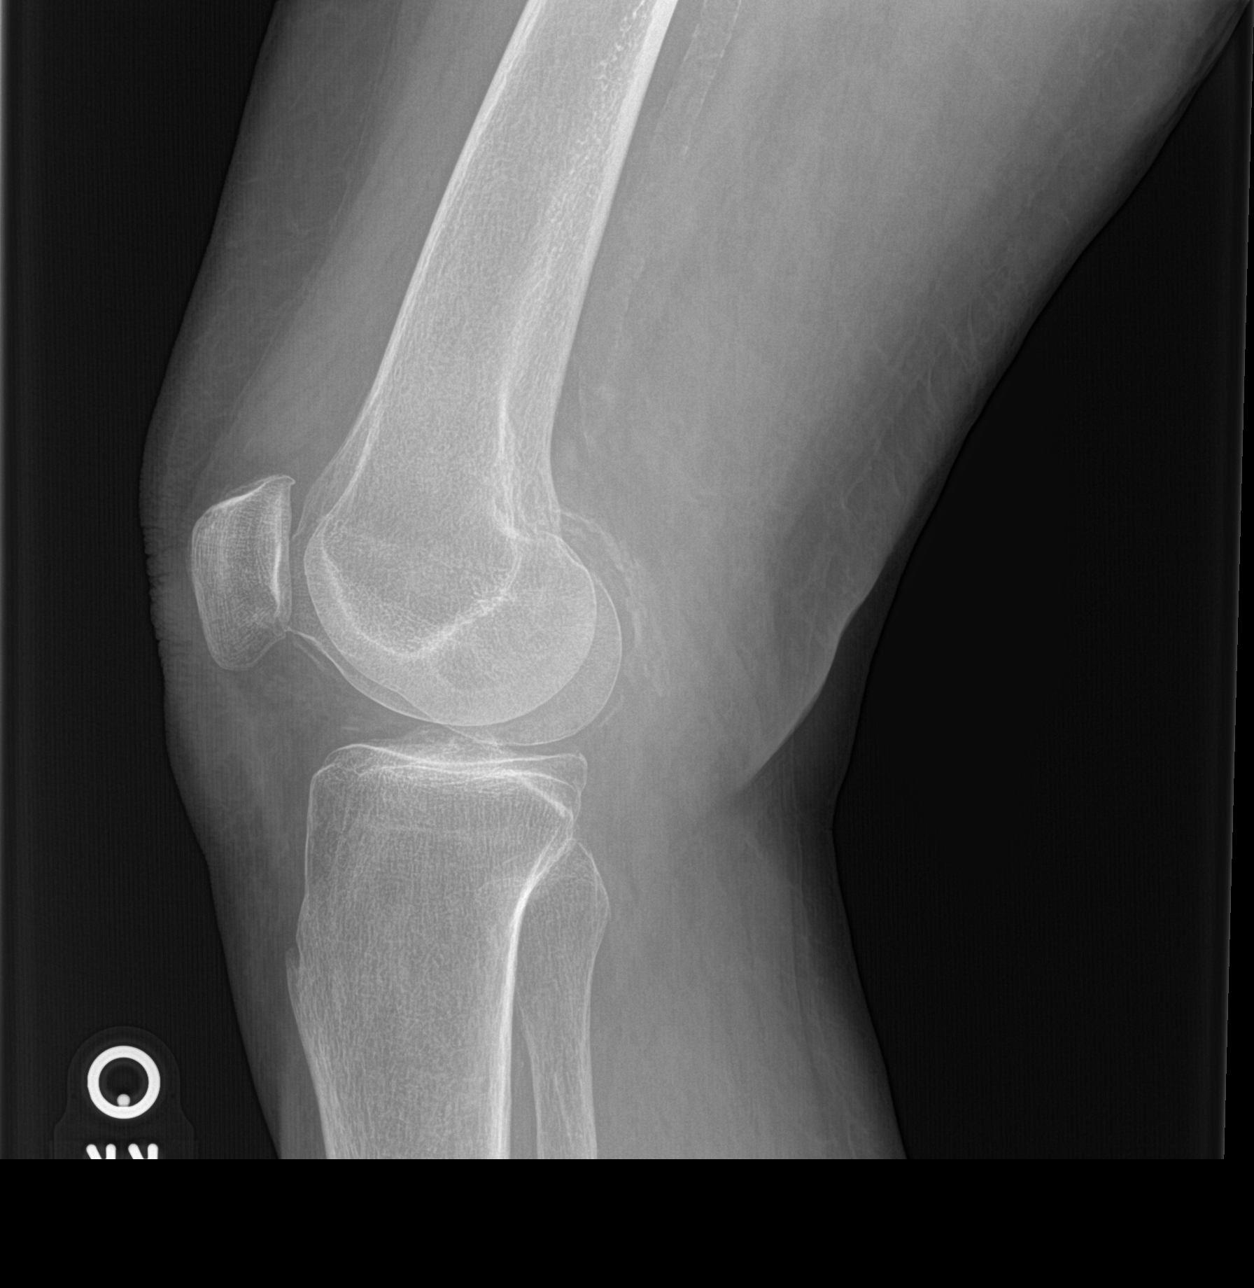

[knee sunrise]
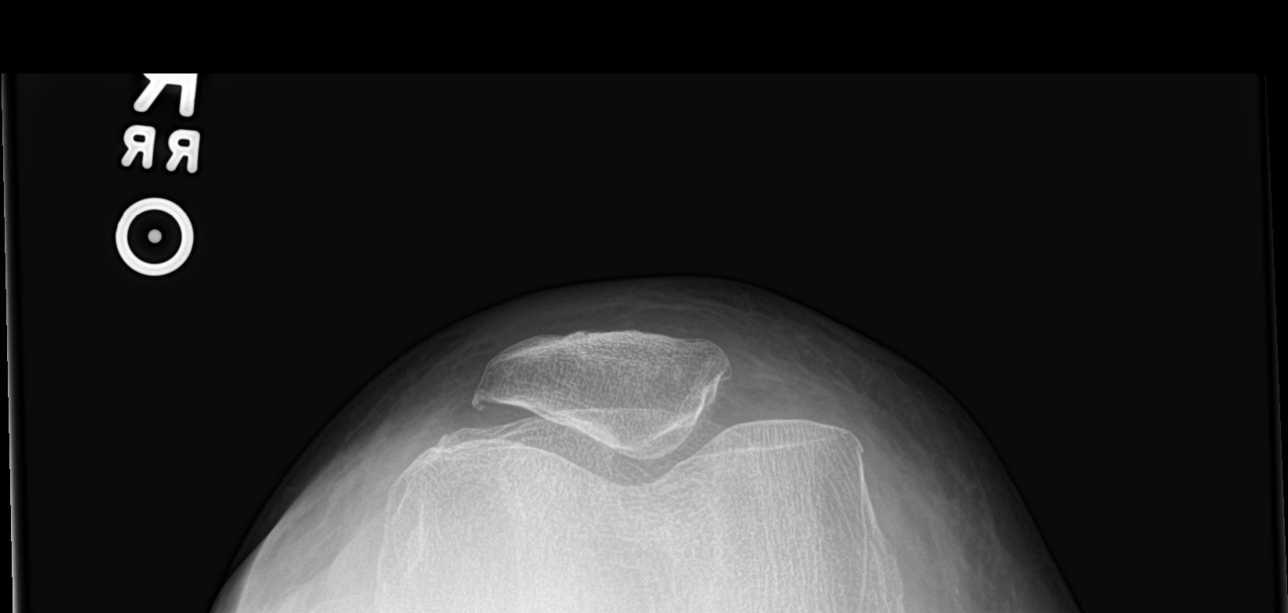

[4 of 4 positions shown; findings below may reference images not displayed]

FINDINGS: There is mild bicompartmental degenerative joint disease the right
knee involving the medial and patellofemoral compartments. No
fracture is seen. There is some chondrocalcinosis present which may
indicate CPPD arthropathy. There does appear to be a small amount of
right knee joint fluid present.
IMPRESSION: 1. Mild bicompartmental degenerative joint disease.
2. Chondrocalcinosis may indicate CPPD.
3. Suspect small amount of right knee joint fluid.

## 2015-12-19 DIAGNOSIS — M25561 Pain in right knee: Secondary | ICD-10-CM | POA: Diagnosis not present

## 2015-12-19 DIAGNOSIS — Z23 Encounter for immunization: Secondary | ICD-10-CM | POA: Diagnosis not present

## 2015-12-19 DIAGNOSIS — G4700 Insomnia, unspecified: Secondary | ICD-10-CM | POA: Diagnosis not present

## 2015-12-19 DIAGNOSIS — G8929 Other chronic pain: Secondary | ICD-10-CM | POA: Diagnosis not present

## 2016-02-01 DIAGNOSIS — Z1239 Encounter for other screening for malignant neoplasm of breast: Secondary | ICD-10-CM | POA: Diagnosis not present

## 2016-02-01 DIAGNOSIS — M8580 Other specified disorders of bone density and structure, unspecified site: Secondary | ICD-10-CM | POA: Diagnosis not present

## 2016-02-01 DIAGNOSIS — J301 Allergic rhinitis due to pollen: Secondary | ICD-10-CM | POA: Diagnosis not present

## 2016-10-16 DIAGNOSIS — Z23 Encounter for immunization: Secondary | ICD-10-CM | POA: Diagnosis not present

## 2017-09-07 DIAGNOSIS — Z23 Encounter for immunization: Secondary | ICD-10-CM | POA: Diagnosis not present

## 2018-08-28 DIAGNOSIS — R5383 Other fatigue: Secondary | ICD-10-CM | POA: Diagnosis not present

## 2018-08-28 DIAGNOSIS — Z1331 Encounter for screening for depression: Secondary | ICD-10-CM | POA: Diagnosis not present

## 2018-08-28 DIAGNOSIS — E785 Hyperlipidemia, unspecified: Secondary | ICD-10-CM | POA: Diagnosis not present

## 2018-08-28 DIAGNOSIS — Z79899 Other long term (current) drug therapy: Secondary | ICD-10-CM | POA: Diagnosis not present

## 2018-08-28 DIAGNOSIS — R82998 Other abnormal findings in urine: Secondary | ICD-10-CM | POA: Diagnosis not present

## 2018-08-28 DIAGNOSIS — Z136 Encounter for screening for cardiovascular disorders: Secondary | ICD-10-CM | POA: Diagnosis not present

## 2018-08-28 DIAGNOSIS — Z7189 Other specified counseling: Secondary | ICD-10-CM | POA: Diagnosis not present

## 2018-08-28 DIAGNOSIS — Z9181 History of falling: Secondary | ICD-10-CM | POA: Diagnosis not present

## 2018-08-29 DIAGNOSIS — C187 Malignant neoplasm of sigmoid colon: Secondary | ICD-10-CM | POA: Diagnosis not present

## 2018-08-29 DIAGNOSIS — K6389 Other specified diseases of intestine: Secondary | ICD-10-CM | POA: Diagnosis not present

## 2018-08-29 DIAGNOSIS — Z01810 Encounter for preprocedural cardiovascular examination: Secondary | ICD-10-CM | POA: Diagnosis not present

## 2018-08-29 DIAGNOSIS — D649 Anemia, unspecified: Secondary | ICD-10-CM | POA: Diagnosis not present

## 2018-08-29 DIAGNOSIS — R799 Abnormal finding of blood chemistry, unspecified: Secondary | ICD-10-CM | POA: Diagnosis not present

## 2018-08-29 DIAGNOSIS — R195 Other fecal abnormalities: Secondary | ICD-10-CM | POA: Diagnosis not present

## 2018-08-29 DIAGNOSIS — K639 Disease of intestine, unspecified: Secondary | ICD-10-CM | POA: Diagnosis not present

## 2018-08-29 DIAGNOSIS — K922 Gastrointestinal hemorrhage, unspecified: Secondary | ICD-10-CM | POA: Diagnosis not present

## 2018-08-29 DIAGNOSIS — N179 Acute kidney failure, unspecified: Secondary | ICD-10-CM | POA: Diagnosis present

## 2018-08-29 DIAGNOSIS — K5730 Diverticulosis of large intestine without perforation or abscess without bleeding: Secondary | ICD-10-CM | POA: Diagnosis present

## 2018-08-29 DIAGNOSIS — C189 Malignant neoplasm of colon, unspecified: Secondary | ICD-10-CM | POA: Diagnosis not present

## 2018-08-29 DIAGNOSIS — D473 Essential (hemorrhagic) thrombocythemia: Secondary | ICD-10-CM | POA: Diagnosis present

## 2018-08-29 DIAGNOSIS — E041 Nontoxic single thyroid nodule: Secondary | ICD-10-CM | POA: Diagnosis present

## 2018-08-29 DIAGNOSIS — D509 Iron deficiency anemia, unspecified: Secondary | ICD-10-CM | POA: Diagnosis present

## 2018-08-29 DIAGNOSIS — C19 Malignant neoplasm of rectosigmoid junction: Secondary | ICD-10-CM | POA: Diagnosis present

## 2018-08-29 DIAGNOSIS — J45909 Unspecified asthma, uncomplicated: Secondary | ICD-10-CM | POA: Diagnosis not present

## 2018-08-29 DIAGNOSIS — R918 Other nonspecific abnormal finding of lung field: Secondary | ICD-10-CM | POA: Diagnosis not present

## 2018-08-29 DIAGNOSIS — C188 Malignant neoplasm of overlapping sites of colon: Secondary | ICD-10-CM | POA: Diagnosis not present

## 2018-08-29 DIAGNOSIS — D374 Neoplasm of uncertain behavior of colon: Secondary | ICD-10-CM | POA: Diagnosis not present

## 2018-08-29 DIAGNOSIS — N1339 Other hydronephrosis: Secondary | ICD-10-CM | POA: Diagnosis present

## 2018-08-29 DIAGNOSIS — C182 Malignant neoplasm of ascending colon: Secondary | ICD-10-CM | POA: Diagnosis present

## 2018-08-29 DIAGNOSIS — C779 Secondary and unspecified malignant neoplasm of lymph node, unspecified: Secondary | ICD-10-CM | POA: Diagnosis not present

## 2018-08-29 DIAGNOSIS — R789 Finding of unspecified substance, not normally found in blood: Secondary | ICD-10-CM | POA: Diagnosis not present

## 2018-08-29 DIAGNOSIS — N323 Diverticulum of bladder: Secondary | ICD-10-CM | POA: Diagnosis not present

## 2018-08-29 DIAGNOSIS — K5790 Diverticulosis of intestine, part unspecified, without perforation or abscess without bleeding: Secondary | ICD-10-CM | POA: Diagnosis not present

## 2018-08-29 DIAGNOSIS — C772 Secondary and unspecified malignant neoplasm of intra-abdominal lymph nodes: Secondary | ICD-10-CM | POA: Diagnosis not present

## 2018-08-29 DIAGNOSIS — N390 Urinary tract infection, site not specified: Secondary | ICD-10-CM | POA: Diagnosis not present

## 2018-08-29 DIAGNOSIS — E440 Moderate protein-calorie malnutrition: Secondary | ICD-10-CM | POA: Diagnosis present

## 2018-08-29 DIAGNOSIS — I10 Essential (primary) hypertension: Secondary | ICD-10-CM | POA: Diagnosis present

## 2018-08-29 DIAGNOSIS — N814 Uterovaginal prolapse, unspecified: Secondary | ICD-10-CM | POA: Diagnosis present

## 2018-08-29 DIAGNOSIS — Z800 Family history of malignant neoplasm of digestive organs: Secondary | ICD-10-CM | POA: Diagnosis not present

## 2018-08-29 DIAGNOSIS — R634 Abnormal weight loss: Secondary | ICD-10-CM | POA: Diagnosis not present

## 2018-08-29 DIAGNOSIS — Z681 Body mass index (BMI) 19 or less, adult: Secondary | ICD-10-CM | POA: Diagnosis not present

## 2018-09-10 DIAGNOSIS — C189 Malignant neoplasm of colon, unspecified: Secondary | ICD-10-CM | POA: Diagnosis not present

## 2018-09-18 DIAGNOSIS — C189 Malignant neoplasm of colon, unspecified: Secondary | ICD-10-CM | POA: Diagnosis not present

## 2018-09-27 DIAGNOSIS — Z23 Encounter for immunization: Secondary | ICD-10-CM | POA: Diagnosis not present

## 2018-10-02 DIAGNOSIS — C189 Malignant neoplasm of colon, unspecified: Secondary | ICD-10-CM | POA: Diagnosis not present

## 2018-11-04 DIAGNOSIS — Z79899 Other long term (current) drug therapy: Secondary | ICD-10-CM | POA: Diagnosis not present

## 2018-11-04 DIAGNOSIS — L03012 Cellulitis of left finger: Secondary | ICD-10-CM | POA: Diagnosis not present

## 2018-11-04 DIAGNOSIS — D630 Anemia in neoplastic disease: Secondary | ICD-10-CM | POA: Diagnosis present

## 2018-11-04 DIAGNOSIS — M19072 Primary osteoarthritis, left ankle and foot: Secondary | ICD-10-CM | POA: Diagnosis not present

## 2018-11-04 DIAGNOSIS — M2550 Pain in unspecified joint: Secondary | ICD-10-CM | POA: Diagnosis not present

## 2018-11-04 DIAGNOSIS — C189 Malignant neoplasm of colon, unspecified: Secondary | ICD-10-CM | POA: Diagnosis present

## 2018-11-04 DIAGNOSIS — J45909 Unspecified asthma, uncomplicated: Secondary | ICD-10-CM | POA: Diagnosis present

## 2018-11-04 DIAGNOSIS — L03114 Cellulitis of left upper limb: Secondary | ICD-10-CM | POA: Diagnosis not present

## 2018-11-04 DIAGNOSIS — Z9049 Acquired absence of other specified parts of digestive tract: Secondary | ICD-10-CM | POA: Diagnosis not present

## 2018-11-07 DIAGNOSIS — C189 Malignant neoplasm of colon, unspecified: Secondary | ICD-10-CM | POA: Diagnosis not present

## 2020-02-01 DEATH — deceased

## 2023-09-03 DEATH — deceased
